# Patient Record
Sex: Male | Born: 1980
Health system: Southern US, Community
[De-identification: ages and names within clinical notes are randomized; demographics above are authoritative.]

---

## 2012-02-22 ENCOUNTER — Emergency Department: Payer: Self-pay | Admitting: Emergency Medicine

## 2013-10-14 ENCOUNTER — Other Ambulatory Visit: Payer: Self-pay | Admitting: Sports Medicine

## 2013-10-14 DIAGNOSIS — S92009A Unspecified fracture of unspecified calcaneus, initial encounter for closed fracture: Secondary | ICD-10-CM

## 2013-10-20 ENCOUNTER — Ambulatory Visit: Payer: Self-pay | Admitting: Sports Medicine

## 2013-11-14 ENCOUNTER — Emergency Department: Payer: Self-pay | Admitting: Emergency Medicine

## 2013-11-14 LAB — CBC
HCT: 45.5 % (ref 40.0–52.0)
HGB: 15.3 g/dL (ref 13.0–18.0)
MCH: 29.2 pg (ref 26.0–34.0)
MCHC: 33.6 g/dL (ref 32.0–36.0)
MCV: 87 fL (ref 80–100)
PLATELETS: 239 10*3/uL (ref 150–440)
RBC: 5.24 10*6/uL (ref 4.40–5.90)
RDW: 12.4 % (ref 11.5–14.5)
WBC: 8.3 10*3/uL (ref 3.8–10.6)

## 2013-11-15 LAB — BASIC METABOLIC PANEL
Anion Gap: 6 — ABNORMAL LOW (ref 7–16)
BUN: 19 mg/dL — ABNORMAL HIGH (ref 7–18)
Calcium, Total: 9.4 mg/dL (ref 8.5–10.1)
Chloride: 106 mmol/L (ref 98–107)
Co2: 26 mmol/L (ref 21–32)
Creatinine: 1.28 mg/dL (ref 0.60–1.30)
EGFR (African American): >60
Glucose: 101 mg/dL — ABNORMAL HIGH (ref 65–99)
Osmolality: 278 (ref 275–301)
POTASSIUM: 3.7 mmol/L (ref 3.5–5.1)
SODIUM: 138 mmol/L (ref 136–145)

## 2013-11-15 LAB — TROPONIN I: Troponin-I: <0.02 ng/mL

## 2013-11-17 DIAGNOSIS — I10 Essential (primary) hypertension: Secondary | ICD-10-CM | POA: Insufficient documentation

## 2013-11-17 DIAGNOSIS — R079 Chest pain, unspecified: Secondary | ICD-10-CM | POA: Insufficient documentation

## 2015-01-16 IMAGING — CT CT OF THE LEFT ANKLE WITHOUT CONTRAST
3 series · 11 of 33 positions shown, 13 images · non-contrast
Comparison: None.

CLINICAL DATA: Fracture of the heel.

EXAM:
CT OF THE LEFT ANKLE WITHOUT CONTRAST
TECHNIQUE: Multidetector CT imaging was performed according to the standard
protocol. Multiplanar CT image reconstructions were also generated.

[Series 6: ax ankle soft tissue 2.0 · axial · 0.20mm/px · z∈[+130,+232]mm · 3 of 83 slices shown, 4 images]
[im 19/83  soft-tissue]
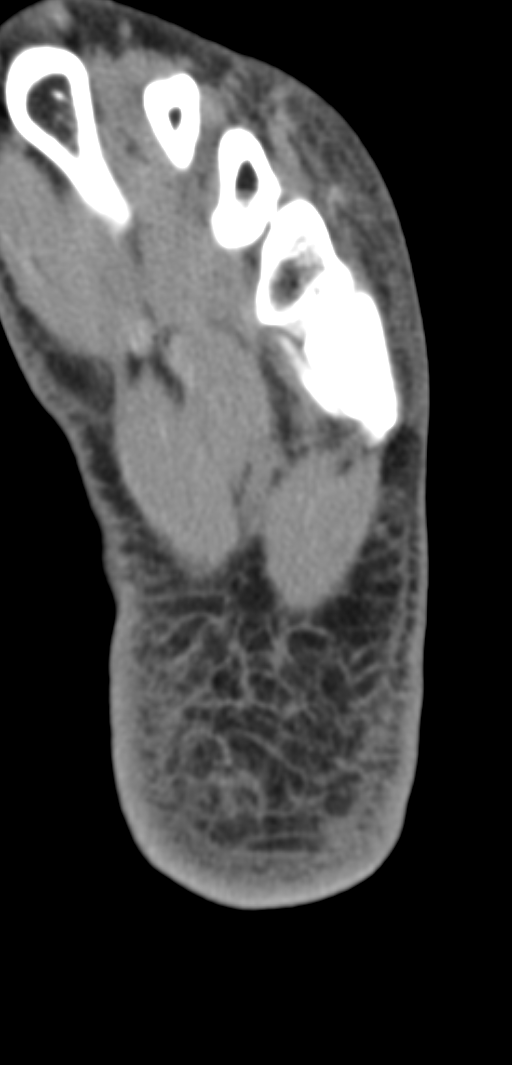
[im 19/83  bone]
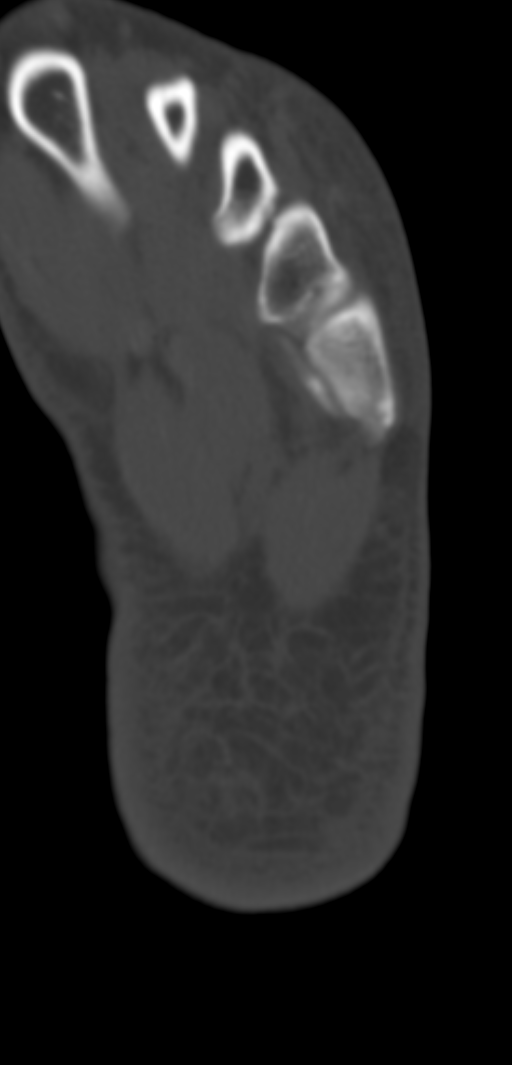
[im 45/83  bone]
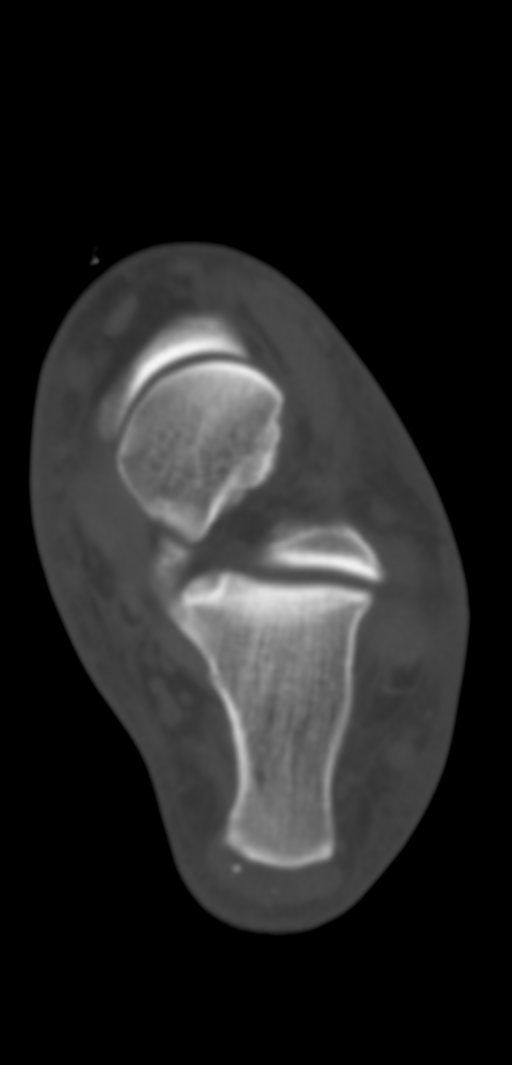
[im 70/83  bone]
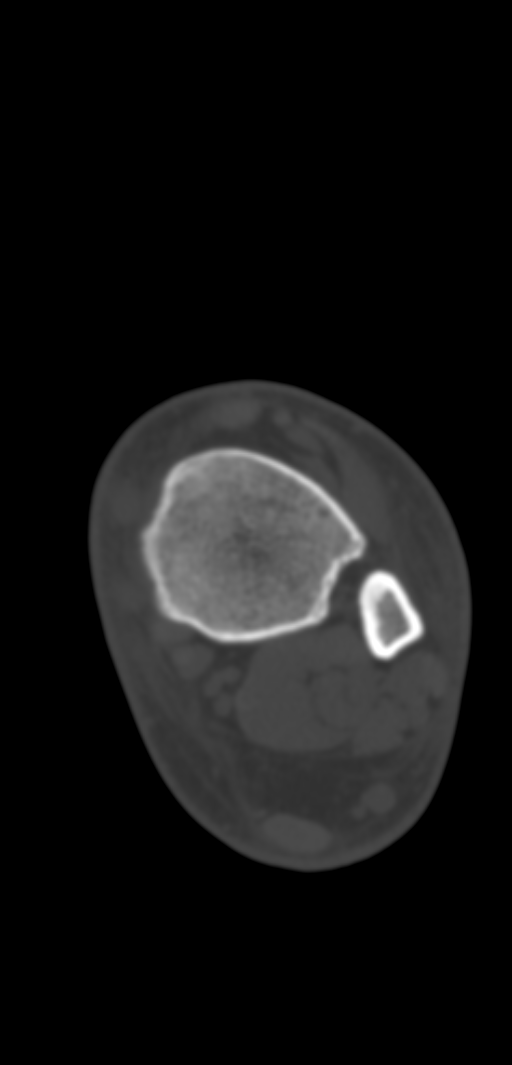

[Series 8: coronal ankle st 2.0 · coronal · 0.26mm/px · 3 of 85 slices shown]
[im 17/85  bone]
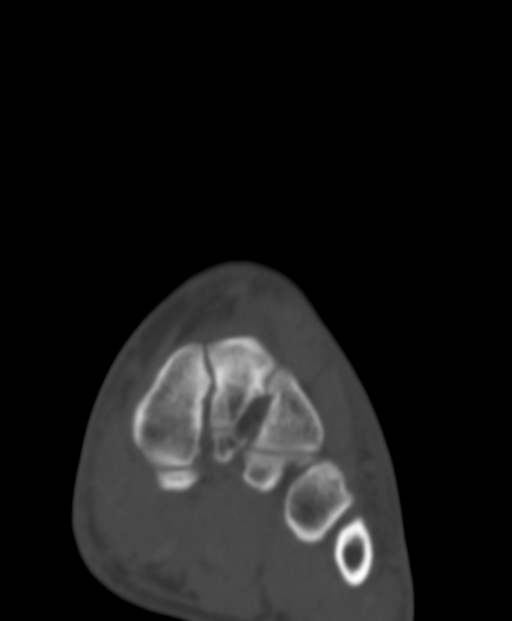
[im 34/85  bone]
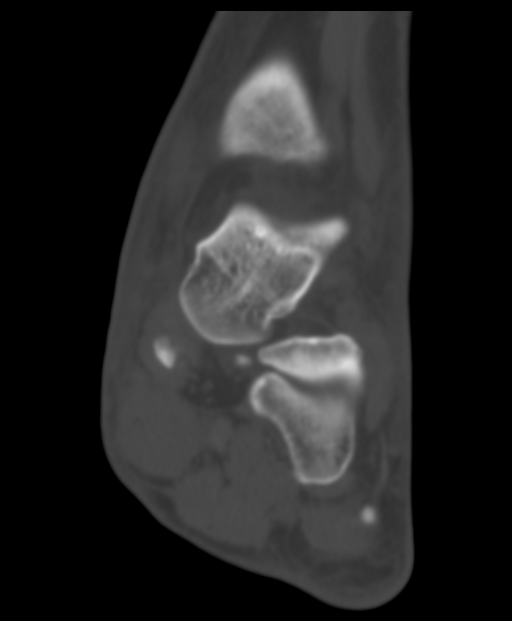
[im 51/85  bone]
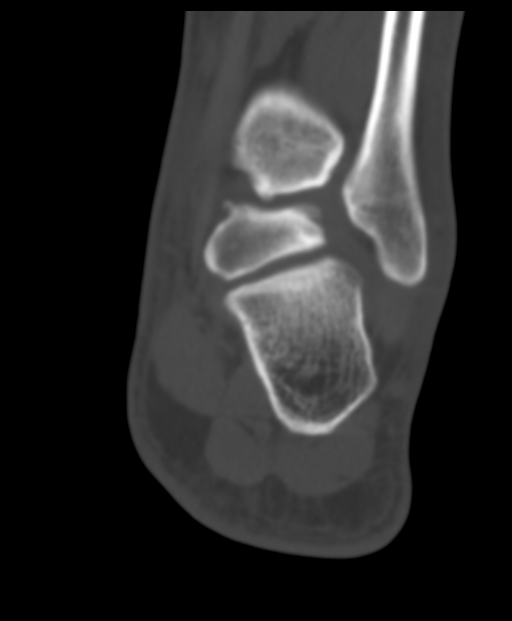

[Series 10: sagittal ankle st 2.0 · sagittal · 0.33mm/px · 5 of 51 slices shown, 6 images]
[im 17/51  bone]
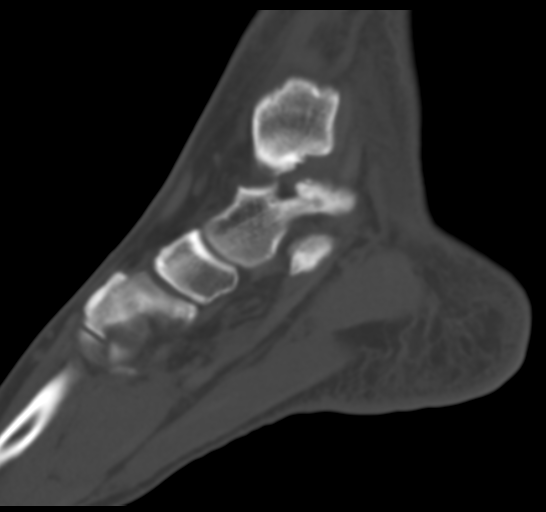
[im 21/51  bone]
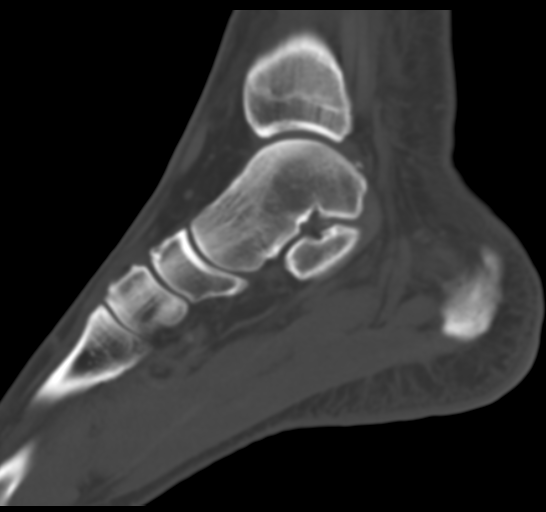
[im 26/51  soft-tissue]
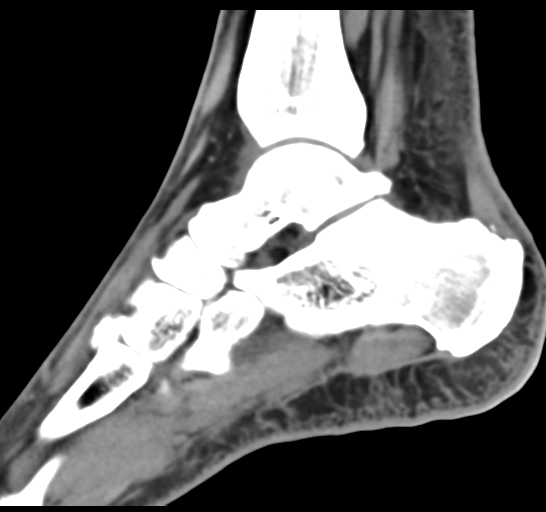
[im 26/51  bone]
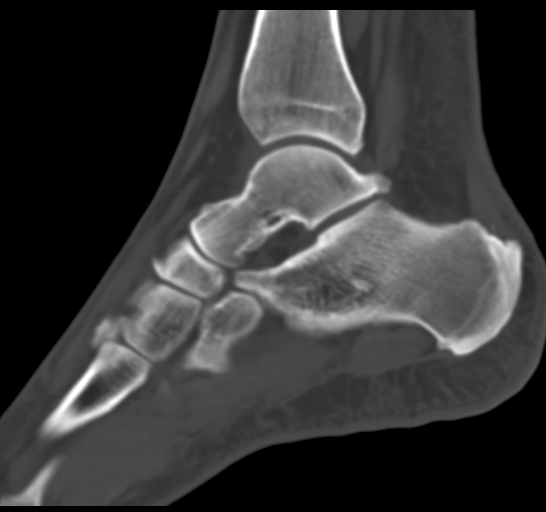
[im 30/51  bone]
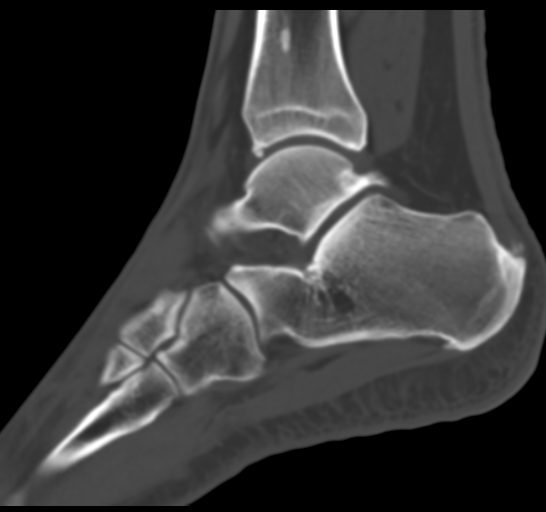
[im 34/51  bone]
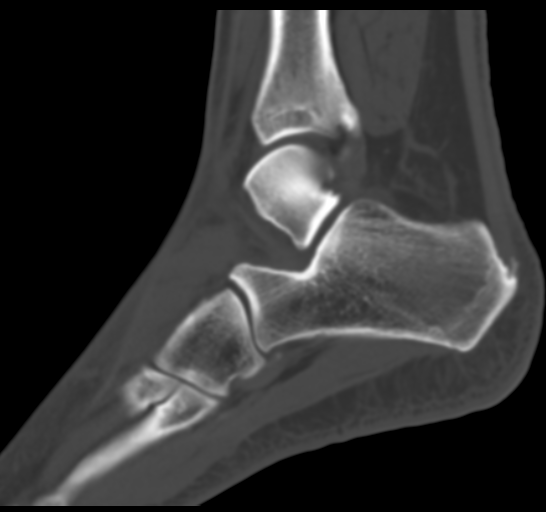

[11 of 33 positions shown; findings below may reference images not displayed]

FINDINGS: Tibiotalar joint spurring noted. An ossicle anteriorly along the
posterior subtalar joint on image 23 of series 9 seems well
corticated and probably represents a chronic fragmented osteophyte.
There are chronic fragmented osteophytes in the distal Achilles
tendon. Achilles and calcaneal spurring noted.

An os peroneus is present. Mild degenerative midfoot spurring. Type
2 accessory navicular. Minimal spur fragmentation along the anterior
tibial rim.

No specific tendon contour abnormality is observed. There is
subcutaneous edema medially and laterally in the ankle and tracking
along the lateral dorsum of the foot. There is edema along the
expected location of the anterior talofibular ligament.
IMPRESSION: 1. No well-defined fracture in the hindfoot is identified. If
clinically feasible, MRI can provide more sensitive assessment for
marrow edema and bone bruising, as well as soft tissue assessment.
2. Several fragmented osteophytes along the hindfoot have a chronic
appearance. Plantar and Achilles spurring is noted.
3. Subcutaneous edema medially and laterally in the ankle, and
tracking in the lateral dorsum of the foot.
4. There is some edema in the vicinity of the anterior talofibular
ligament, which could be sprained.

## 2019-04-01 ENCOUNTER — Emergency Department: Payer: Managed Care, Other (non HMO)

## 2019-04-01 ENCOUNTER — Other Ambulatory Visit: Payer: Self-pay

## 2019-04-01 ENCOUNTER — Emergency Department
Admission: EM | Admit: 2019-04-01 | Discharge: 2019-04-01 | Disposition: A | Discharge status: Home / Self Care | Payer: Managed Care, Other (non HMO) | Attending: Student in an Organized Health Care Education/Training Program | Admitting: Student in an Organized Health Care Education/Training Program

## 2019-04-01 DIAGNOSIS — Z20828 Contact with and (suspected) exposure to other viral communicable diseases: Secondary | ICD-10-CM | POA: Diagnosis not present

## 2019-04-01 DIAGNOSIS — J189 Pneumonia, unspecified organism: Secondary | ICD-10-CM | POA: Insufficient documentation

## 2019-04-01 DIAGNOSIS — R509 Fever, unspecified: Secondary | ICD-10-CM | POA: Diagnosis present

## 2019-04-01 LAB — CBC WITH DIFFERENTIAL/PLATELET
Abs Immature Granulocytes: 0.01 10*3/uL (ref 0.00–0.07)
Basophils Absolute: 0 10*3/uL (ref 0.0–0.1)
Basophils Relative: 0 %
Eosinophils Absolute: 0 10*3/uL (ref 0.0–0.5)
Eosinophils Relative: 0 %
HCT: 40.7 % (ref 39.0–52.0)
Hemoglobin: 13.8 g/dL (ref 13.0–17.0)
Immature Granulocytes: 0 %
Lymphocytes Relative: 24 %
Lymphs Abs: 0.7 10*3/uL (ref 0.7–4.0)
MCH: 29.1 pg (ref 26.0–34.0)
MCHC: 33.9 g/dL (ref 30.0–36.0)
MCV: 85.7 fL (ref 80.0–100.0)
Monocytes Absolute: 0.2 10*3/uL (ref 0.1–1.0)
Monocytes Relative: 6 %
Neutro Abs: 2.1 10*3/uL (ref 1.7–7.7)
Neutrophils Relative %: 70 %
Platelets: 155 10*3/uL (ref 150–400)
RBC: 4.75 MIL/uL (ref 4.22–5.81)
RDW: 11.5 % (ref 11.5–15.5)
WBC: 2.9 10*3/uL — ABNORMAL LOW (ref 4.0–10.5)
nRBC: 0 % (ref 0.0–0.2)

## 2019-04-01 LAB — URINALYSIS, COMPLETE (UACMP) WITH MICROSCOPIC
Bacteria, UA: NONE SEEN
Bilirubin Urine: NEGATIVE
Glucose, UA: NEGATIVE mg/dL
Hgb urine dipstick: NEGATIVE
Ketones, ur: NEGATIVE mg/dL
Leukocytes,Ua: NEGATIVE
Nitrite: NEGATIVE
Protein, ur: 100 mg/dL — AB
Specific Gravity, Urine: 1.029 (ref 1.005–1.030)
pH: 5 (ref 5.0–8.0)

## 2019-04-01 LAB — COMPREHENSIVE METABOLIC PANEL
ALT: 97 U/L — ABNORMAL HIGH (ref 0–44)
AST: 77 U/L — ABNORMAL HIGH (ref 15–41)
Albumin: 3.7 g/dL (ref 3.5–5.0)
Alkaline Phosphatase: 71 U/L (ref 38–126)
Anion gap: 10 (ref 5–15)
BUN: 12 mg/dL (ref 6–20)
CO2: 24 mmol/L (ref 22–32)
Calcium: 8.5 mg/dL — ABNORMAL LOW (ref 8.9–10.3)
Chloride: 102 mmol/L (ref 98–111)
Creatinine, Ser: 0.96 mg/dL (ref 0.61–1.24)
GFR calc Af Amer: >60 mL/min (ref >60)
GFR calc non Af Amer: >60 mL/min (ref >60)
Glucose, Bld: 111 mg/dL — ABNORMAL HIGH (ref 70–99)
Potassium: 3.8 mmol/L (ref 3.5–5.1)
Sodium: 136 mmol/L (ref 135–145)
Total Bilirubin: 0.8 mg/dL (ref 0.3–1.2)
Total Protein: 7.1 g/dL (ref 6.5–8.1)

## 2019-04-01 LAB — INFLUENZA PANEL BY PCR (TYPE A & B)
Influenza A By PCR: NEGATIVE
Influenza B By PCR: NEGATIVE

## 2019-04-01 LAB — LACTIC ACID, PLASMA: Lactic Acid, Venous: 1 mmol/L (ref 0.5–1.9)

## 2019-04-01 LAB — SARS CORONAVIRUS 2 BY RT PCR (HOSPITAL ORDER, PERFORMED IN ~~LOC~~ HOSPITAL LAB): SARS Coronavirus 2: NEGATIVE

## 2019-04-01 MED ORDER — IBUPROFEN 600 MG PO TABS
600.0000 mg | ORAL_TABLET | Freq: Once | ORAL | Status: AC
  Administered 2019-04-01: 17:00:00 600 mg via ORAL
  Filled 2019-04-01: qty 1

## 2019-04-01 MED ORDER — AZITHROMYCIN 250 MG PO TABS: ORAL_TABLET | ORAL | 0 refills | Status: AC

## 2019-04-01 MED ORDER — SODIUM CHLORIDE 0.9 % IV BOLUS
1000.0000 mL | Freq: Once | INTRAVENOUS | Status: AC
  Administered 2019-04-01: 18:00:00 1000 mL via INTRAVENOUS

## 2019-04-01 MED ORDER — SODIUM CHLORIDE 0.9 % IV SOLN
1.0000 g | Freq: Once | INTRAVENOUS | Status: AC
  Administered 2019-04-01: 19:00:00 1 g via INTRAVENOUS
  Filled 2019-04-01: qty 10

## 2019-04-01 NOTE — ED Triage Notes (Addendum)
Reports sinus congestion X approx 1 week-states that he was seen at [facility] and dx with URI. Pt has been taking his antibiotics X 3 days. Pt reports cough, congestion. Denies sore throat.   Pt has not been tested for coronavirus. Pt alert and oriented X4, cooperative, RR even and unlabored, color WNL. Pt in NAD. Pt took his OTC cough and decongestant medications that included tylenol PTA. Fever started this afternoon.

## 2019-04-01 NOTE — ED Provider Notes (Signed)
[facility] Emergency Department Provider Note  ____________________________________________  Time seen: Approximately 5:24 PM  I have reviewed the triage vital signs and the nursing notes.   HISTORY  Chief Complaint Facial Pain and Fever    HPI Duane Shaw is a 38 y.o. male presents to the emergency department with fever that started today.  Patient reports that his fever has been as high as [address] F assessed orally at home and has not improved after taking 1500 mg of Tylenol.  Patient states that 3 days ago, he sought care with a local urgent care due to headache and nasal congestion.  Patient states that he has since developed cough and body aches today and has had mild diarrhea.  Patient states that he associated diarrhea with amoxicillin use as he was diagnosed with sinusitis by urgent care.  He denies chest pain, chest tightness or abdominal pain.  He denies neck pain or recent tick bites.  No new rash.  Patient states that he has had chills at home.  No other alleviating measures have been attempted.        History reviewed. No pertinent past medical history.  There are no active problems to display for this patient.   History reviewed. No pertinent surgical history.  Prior to Admission medications   Medication Sig Start Date End Date Taking? Authorizing Provider  amoxicillin-clavulanate (AUGMENTIN) 875-125 MG tablet Take 1 tablet by mouth 2 (two) times daily. 03/29/19  Yes [provider]  azithromycin (ZITHROMAX Z-PAK) 250 MG tablet Take 2 tablets (500 mg) on  Day 1,  followed by 1 tablet (250 mg) once daily on Days 2 through 5. 04/01/19 04/06/19  Orvil FeilWoods, Lanell Dubie M, PA-C    Allergies Patient has no known allergies.  No family history on file.  Social History Social History   Tobacco Use  . Smoking status: Never Smoker  Substance Use Topics  . Alcohol use: Yes  . Drug use: Not on file      Review of Systems  Constitutional:  Patient has fever.  Eyes: No visual changes. No discharge ENT: Patient has congestion.  Cardiovascular: no chest pain. Respiratory: Patient has cough.  Gastrointestinal: No abdominal pain.  No nausea, no vomiting. Patient had diarrhea.  Genitourinary: Negative for dysuria. No hematuria Musculoskeletal: Patient has myalgias.  Skin: Negative for rash, abrasions, lacerations, ecchymosis. Neurological: Patient has headache, no focal weakness or numbness.     ____________________________________________   PHYSICAL EXAM:  VITAL SIGNS: ED Triage Vitals [04/01/19 1631]  Enc Vitals Group     BP 136/70     Pulse Rate (!) 107     Resp 18     Temp (!) 102.6 F (39.2 C)     Temp Source Oral     SpO2 100 %     Weight 280 lb (127 kg)     Height 6\' 2"  (1.88 m)     Head Circumference      Peak Flow      Pain Score 5     Pain Loc      Pain Edu?      Excl. in GC?      Constitutional: Alert and oriented. Patient is lying supine. Eyes: Conjunctivae are normal. PERRL. EOMI. Head: Atraumatic. ENT:      Ears: Tympanic membranes are mildly injected with mild effusion bilaterally.       Nose: No congestion/rhinnorhea.      Mouth/Throat: Mucous membranes are moist. Posterior pharynx is mildly erythematous.  Hematological/Lymphatic/Immunilogical: No cervical lymphadenopathy.  Cardiovascular: Normal rate, regular rhythm. Normal S1 and S2.  Good peripheral circulation. Respiratory: Normal respiratory effort without tachypnea or retractions. Lungs CTAB. Good air entry to the bases with no decreased or absent breath sounds. Gastrointestinal: Bowel sounds 4 quadrants. Soft and nontender to palpation. No guarding or rigidity. No palpable masses. No distention. No CVA tenderness. Musculoskeletal: Full range of motion to all extremities. No gross deformities appreciated. Neurologic:  Normal speech and language. No gross focal neurologic deficits are appreciated.  Skin:  Skin is warm, dry and  intact. No rash noted. Psychiatric: Mood and affect are normal. Speech and behavior are normal. Patient exhibits appropriate insight and judgement.    ____________________________________________   LABS (all labs ordered are listed, but only abnormal results are displayed)  Labs Reviewed  CBC WITH DIFFERENTIAL/PLATELET - Abnormal; Notable for the following components:      Result Value   WBC 2.9 (*)    All other components within normal limits  COMPREHENSIVE METABOLIC PANEL - Abnormal; Notable for the following components:   Glucose, Bld 111 (*)    Calcium 8.5 (*)    AST 77 (*)    ALT 97 (*)    All other components within normal limits  URINALYSIS, COMPLETE (UACMP) WITH MICROSCOPIC - Abnormal; Notable for the following components:   Color, Urine YELLOW (*)    APPearance HAZY (*)    Protein, ur 100 (*)    All other components within normal limits  SARS CORONAVIRUS 2 BY RT PCR (HOSPITAL ORDER, PERFORMED IN [facility])  INFLUENZA PANEL BY PCR (TYPE A & B)  LACTIC ACID, PLASMA   ____________________________________________  EKG   ____________________________________________  RADIOLOGY I personally viewed and evaluated these images as part of my medical decision making, as well as reviewing the written report by the radiologist.    Dg Chest 1 View  Result Date: 04/01/2019 CLINICAL DATA:  38 year old male with concern for pneumonia. EXAM: CHEST  1 VIEW COMPARISON:  Chest radiograph dated 11/14/2013 FINDINGS: Mild diffuse interstitial prominence and streaky densities which may represent atelectasis or developing infiltrate. Clinical correlation is recommended. No focal consolidation, pleural effusion, or pneumothorax. The cardiac silhouette is within normal limits. No acute osseous pathology. IMPRESSION: 1. No focal consolidation. 2. Mild diffuse interstitial prominence and streaky densities may represent atelectasis or developing infiltrate. Electronically Signed    By: Elgie Collard M.D.   On: 04/01/2019 17:40    ____________________________________________    PROCEDURES  Procedure(s) performed:    Procedures    Medications  sodium chloride 0.9 % bolus 1,000 mL (0 mLs Intravenous Stopped 04/01/19 1930)  ibuprofen (ADVIL) tablet 600 mg (600 mg Oral Given 04/01/19 1707)  cefTRIAXone (ROCEPHIN) 1 g in sodium chloride 0.9 % 100 mL IVPB (0 g Intravenous Stopped 04/01/19 1933)     ____________________________________________   INITIAL IMPRESSION / ASSESSMENT AND PLAN / ED COURSE  Pertinent labs & imaging results that were available during my care of the patient were reviewed by me and considered in my medical decision making (see chart for details).  Review of the [place] CSRS was performed in accordance of the [facility] prior to dispensing any controlled drugs.          Assessment and Plan:  Pneumonia 38 year old male presents to the emergency department with fever and mild tachycardia.  Patient states for the past 3 to 4 days, he has had headache, body aches, nasal congestion and mild diarrhea.  He has been  taking amoxicillin for prior diagnosis of sinusitis by local urgent care.  On physical exam, patient is active and talkative.  His mucous membranes are moist.  No adventitious lung sounds were auscultated on exam patient had no increased work of breathing.   Differential diagnosis included community-acquired pneumonia, COVID-19, unspecified viral URI, influenza..  Chest x-ray revealed streaky densities suggesting early infiltrate.  Basic labs were obtained and LFTs were mildly elevated.  Leukopenia was identified on CBC, 2.9.  COVID-19 testing was negative.  Influenza a and B testing was negative.  Lactic was within reference range.  Patient received supplemental fluids in the emergency department as well as IV Rocephin.  I suspect a viral source for source for early pneumonia on chest x-ray given leukopenia on CBC.  Patient was  covered with azithromycin at discharge.  He was advised to discontinue amoxicillin use.  Return precautions were given to return to the emergency department with new or worsening symptoms.  He voiced understanding.  Fever trended down with ibuprofen given in ED.   Duane Shaw was evaluated in Emergency Department on 04/01/2019 for the symptoms described in the history of present illness. He was evaluated in the context of the global COVID-19 pandemic, which necessitated consideration that the patient might be at risk for infection with the SARS-CoV-2 virus that causes COVID-19. Institutional protocols and algorithms that pertain to the evaluation of patients at risk for COVID-19 are in a state of rapid change based on information released by regulatory bodies including the CDC and federal and state organizations. These policies and algorithms were followed during the patient's care in the ED.   ____________________________________________  FINAL CLINICAL IMPRESSION(S) / ED DIAGNOSES  Final diagnoses:  Community acquired pneumonia, unspecified laterality      NEW MEDICATIONS STARTED DURING THIS VISIT:  ED Discharge Orders         Ordered    azithromycin (ZITHROMAX Z-PAK) 250 MG tablet     04/01/19 1905              This chart was dictated using voice recognition software/Dragon. Despite best efforts to proofread, errors can occur which can change the meaning. Any change was purely unintentional.    Lannie Fields, PA-C 04/01/19 2025    Merlyn Lot, MD 04/01/19 2208

## 2019-05-04 DIAGNOSIS — R7303 Prediabetes: Secondary | ICD-10-CM | POA: Insufficient documentation

## 2020-06-27 IMAGING — DX DG CHEST 1V
1 series · 1 of 1 positions shown · non-contrast
Comparison: Chest radiograph dated 11/14/2013

CLINICAL DATA: 38-year-old male with concern for pneumonia.

EXAM:
CHEST  1 VIEW

[chest ap]
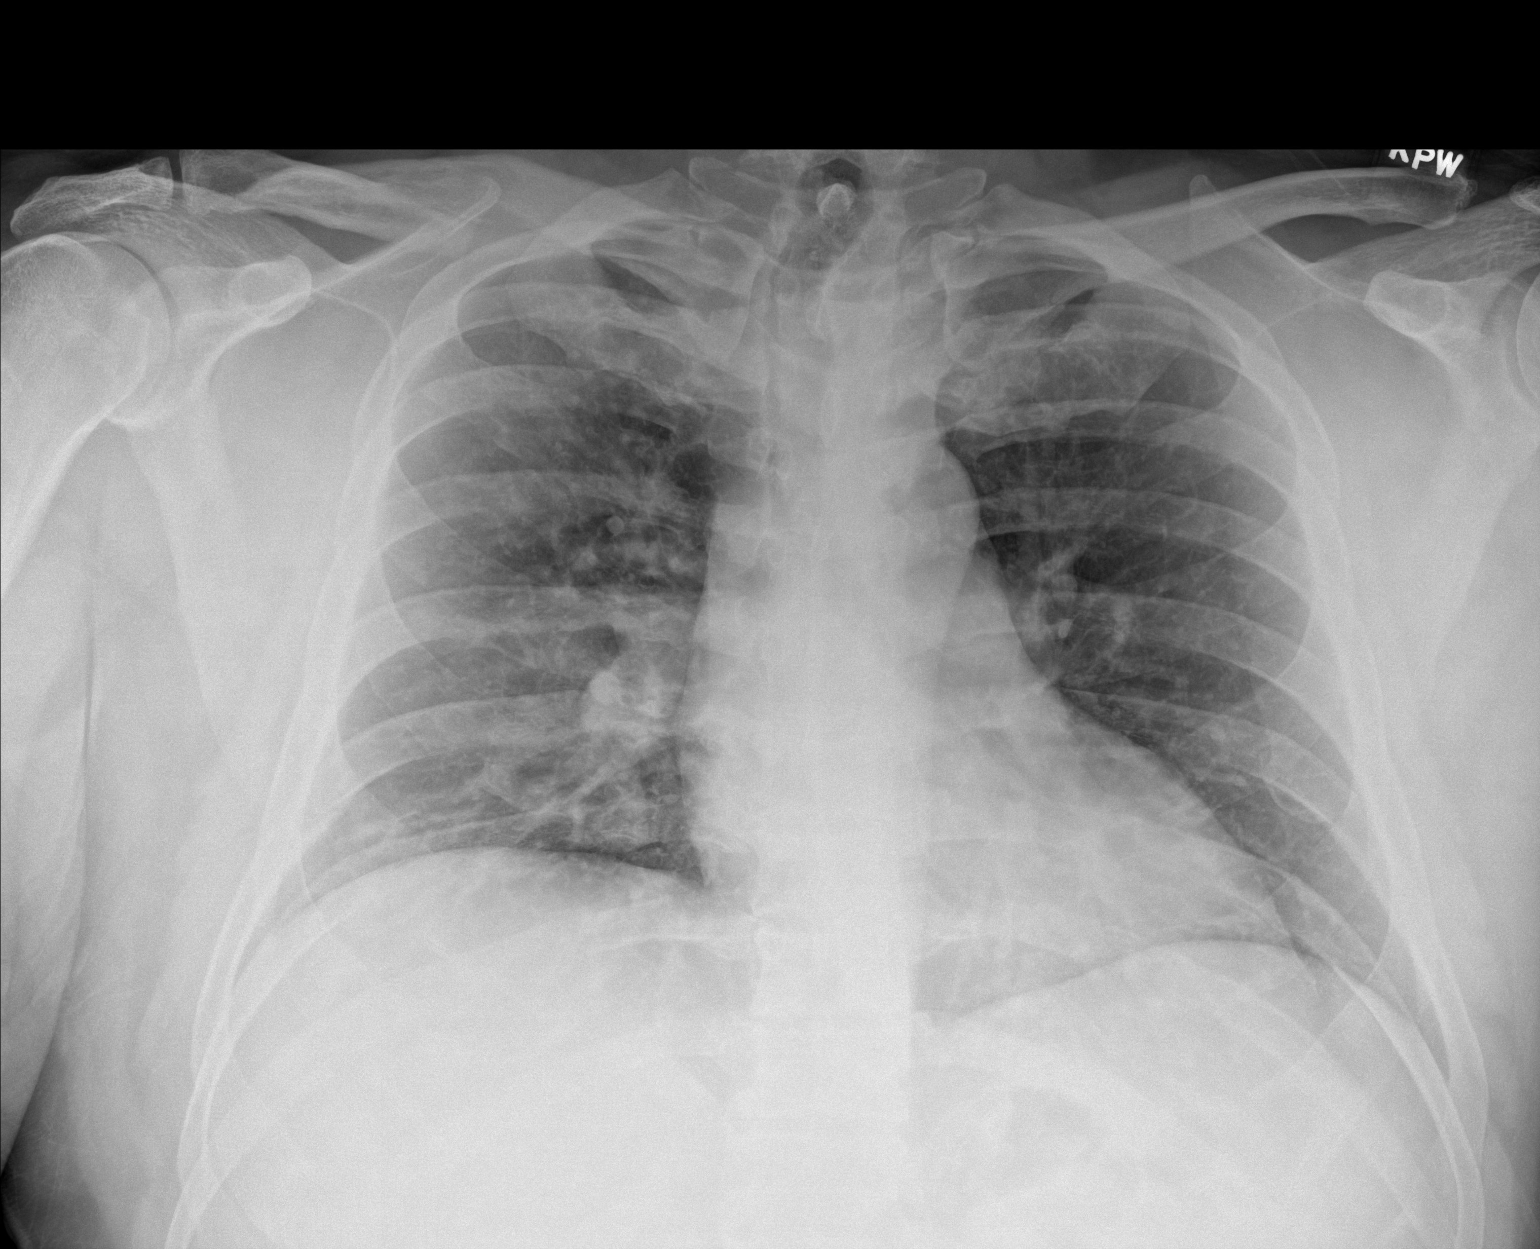

[1 of 1 positions shown; findings below may reference images not displayed]

FINDINGS: Mild diffuse interstitial prominence and streaky densities which may
represent atelectasis or developing infiltrate. Clinical correlation
is recommended. No focal consolidation, pleural effusion, or
pneumothorax. The cardiac silhouette is within normal limits. No
acute osseous pathology.
IMPRESSION: 1. No focal consolidation.
2. Mild diffuse interstitial prominence and streaky densities may
represent atelectasis or developing infiltrate.

## 2021-08-23 NOTE — Progress Notes (Signed)
? ? ?  Subjective:   ? ?CC: L foot pain ? ?I, Wendy Poet, LAT, ATC, am serving as scribe for Dr. [name]. ? ?HPI: Pt is a 41 y/o male presenting w/ c/o L foot pain x 5 months.  He locates his pain to his L medial calcaneus. ?He has tried multiple treatment options including band based exercises night splints icing and orthotics as below.  He is about to get custom orthotics ordered by [employer]. ? 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  He needs to wear protective shoes for this. ? ?L foot swelling: yes ?Aggravating factors: walking/weight-bearing activity;  ?Treatments tried: [employer] inserts ordered but hasn't received; Dr. Felicie Morn PF inserts; ice; night splint; massage gun to his calf; calf stretching ? ?Pertinent review of Systems: No fevers or chills ? ?Relevant historical information: History of shoulder pain ultimately requiring shoulder surgery. ? ? ?Objective:   ? ?Vitals:  ? 08/24/21 0827  ?BP: 130/88  ?Pulse: 75  ?SpO2: 95%  ? ?General: Well Developed, well nourished, and in no acute distress.  ? ?MSK: Left foot mild pes planus.  Tender palpation along plantar medial calcaneus. ?Also tender palpation along the medial ankle along the course of the posterior tibialis tendon. ?Normal foot and ankle motion. ?Intact strength. ?Antalgic gait. ? ?Lab and Radiology Results ? ?X-ray images left calcaneus obtained today personally and independently interpreted ?Calcaneal osteophyte at the plantar and posterior aspect of the calcaneus. ?No acute fractures are visible. ?Await formal radiology review  ? ? ? ?Impression and Recommendations:   ? ?Assessment and Plan: ?41 y.o. male with left plantar calcaneal pain.  This is a chronic issue now ongoing for 5 months despite good trial of conservative management including home exercise program, arch support, heel cushioning, icing, and arch straps.  Recommend maximizing  conservative management to include weight based eccentric Alfredson's type exercises.  And will start nitroglycerin patch protocol.  Recheck in 1 month.  If not improved consider steroid injection or switch to extracorporeal shockwave therapy.  May be moving shortly to MRI in the near future.. ? ?PDMP not reviewed this encounter. ?Orders Placed This Encounter  ?Procedures  ? [place] LIMITED JOINT SPACE STRUCTURES LOW LEFT(NO LINKED CHARGES)  ?  Order Specific Question:   Reason for Exam (SYMPTOM  OR DIAGNOSIS REQUIRED)  ?  Answer:   L foot pain  ?  Order Specific Question:   Preferred imaging location?  ?  Answer:   [place]  ? DG Os Calcis Left  ?  Standing Status:   Future  ?  Number of Occurrences:   1  ?  Standing Expiration Date:   08/25/2022  ?  Order Specific Question:   Reason for Exam (SYMPTOM  OR DIAGNOSIS REQUIRED)  ?  Answer:   heel pain  ?  Order Specific Question:   Preferred imaging location?  ?  Answer:   [facility]  ? ?Meds ordered this encounter  ?Medications  ? nitroGLYCERIN (NITRODUR - DOSED IN MG/24 HR) 0.2 mg/hr patch  ?  Sig: Apply 1/4 patch daily to tendon for tendonitis.  ?  Dispense:  30 patch  ?  Refill:  1  ? ? ?Discussed warning signs or symptoms. Please see discharge instructions. Patient expresses understanding. ? ? ?The above documentation has been reviewed and is accurate and complete [name], M.D. ? ?

## 2021-08-24 ENCOUNTER — Ambulatory Visit (INDEPENDENT_AMBULATORY_CARE_PROVIDER_SITE_OTHER): Payer: 59

## 2021-08-24 ENCOUNTER — Ambulatory Visit (INDEPENDENT_AMBULATORY_CARE_PROVIDER_SITE_OTHER): Payer: 59 | Admitting: Family Medicine

## 2021-08-24 ENCOUNTER — Ambulatory Visit: Payer: Self-pay

## 2021-08-24 ENCOUNTER — Other Ambulatory Visit: Payer: Self-pay

## 2021-08-24 VITALS — BP 130/88 | HR 75 | Ht 74.0 in | Wt 293.2 lb

## 2021-08-24 DIAGNOSIS — M79672 Pain in left foot: Secondary | ICD-10-CM

## 2021-08-24 DIAGNOSIS — M722 Plantar fascial fibromatosis: Secondary | ICD-10-CM

## 2021-08-24 MED ORDER — NITROGLYCERIN 0.2 MG/HR TD PT24: MEDICATED_PATCH | TRANSDERMAL | 1 refills | Status: AC

## 2021-08-24 NOTE — Patient Instructions (Addendum)
Nice to meet you today. ? ?Do the slow heel lowering exercises as discussed during your visit today, taking 5-7 sec to lower down from a raised heel position.  Do 30-45 reps daily. ? ?Nitroglycerin Protocol ? ?Apply 1/4 nitroglycerin patch to affected area daily. ?Change position of patch within the affected area every 24 hours. ?You may experience a headache during the first 1-2 weeks of using the patch, these should subside. ?If you experience headaches after beginning nitroglycerin patch treatment, you may take your preferred over the counter pain reliever. ?Another side effect of the nitroglycerin patch is skin irritation or rash related to patch adhesive. ?Please notify our office if you develop more severe headaches or rash, and stop the patch. ?Tendon healing with nitroglycerin patch may require 12 to 24 weeks depending on the extent of injury. ?Men should not use if taking Viagra, Cialis, or Levitra.  ?Do not use if you have migraines or rosacea.   ? ?Follow-up: 1 month.  ? ?Xray today.  ? ? ?[employer].  ? ? ?

## 2021-08-24 NOTE — Progress Notes (Signed)
Left heel x-ray does show a heel spur at the plantar fascia insertion site and at the Achilles tendon insertion site at the heel.  Otherwise the heel bone looks okay on x-ray.

## 2021-08-25 ENCOUNTER — Telehealth: Payer: Self-pay | Admitting: Family Medicine

## 2021-08-25 NOTE — Telephone Encounter (Signed)
Called pt and advised that he is to con't w/ his HEP as prescribed at his visit earlier this week.  Regarding nitro patches, advised pt that Dr. Denyse Amass called pt's pharmacy this morning and left a message on their VM advising them to prescribe patches like he instructed.  We are using the nitro patches for an orthopedic reason, and therefore, it's fine to cut the patches into quarters as prescribed.  Advise pt to contact us back if he con't to have an issue w/ his pharmacy and the nitro patches.  Pt verbalizes understanding. ?

## 2021-08-25 NOTE — Telephone Encounter (Signed)
Pt called, viewed xray results and wonders if their should be any change in the prescribed exercises. ? ?Also, unable to p/u Nitro as pharmacy states they reached out to [place] for [employer]. ?

## 2021-08-25 NOTE — Telephone Encounter (Signed)
I received a fax from [facility] pharmacy in [place] informing me that nitroglycerin patches should not be cut.  I called the pharmacy and left a message with the pharmacist saying that I am aware of this but asked them to dispense them anyway.  I asked the pharmacist to call me back in my office number if there are any problems. ?

## 2021-09-20 NOTE — Progress Notes (Signed)
? ?  I, Christoper Fabian, LAT, ATC, am serving as scribe for Dr. Clementeen Graham. ? ?Duane Shaw is a 41 y.o. male who presents to [facility] today for f/u of L medial heel pain/plantar fasciitis.  He was last seen by Dr. Denyse Amass on 08/24/21 and was shown Duane Shaw exercises and started on nitroglycerin patches.  He was also advised to use a CAM walker boot.  Today, pt reports that he notices little change in his symptoms.  He is using the nitro patches and has been doing his Duane Shaw exercises.  He states that he is not able to do cardio at the gym due to the pain.  He locates his pain to the plantar border of his calcaneus w/ radiating pain into his L medial arch. ? ?Diagnostic testing: L heel XR- 08/24/21 ? ?Pertinent review of systems: no fever or chills ? ?Relevant historical information: HTN ? ? ?Exam:  ?BP 104/80 (BP Location: Right Arm, Patient Position: Sitting, Cuff Size: Large)   Pulse 78   Ht 6\' 2"  (1.88 m)   Wt 290 lb (131.5 kg)   SpO2 96%   BMI 37.23 kg/m?  ?General: Well Developed, well nourished, and in no acute distress.  ? ?MSK: Left Heel: ?Normal appearing ?Mildly tender palpation plantar heel. ?Normal foot and ankle motion. ?Mild antalgic gait. ? ? ? ?Lab and Radiology Results ?EXAM: ?LEFT OS CALCIS - 2+ VIEW ?  ?COMPARISON:  CT left ankle 10/20/2013 ?  ?FINDINGS: ?Moderate chronic spurring at the Achilles insertion on the ?calcaneus. Moderate plantar calcaneal heel spur appears mildly ?increased from 10/20/2013 CT. Mild dorsal navicular-cuneiform ?degenerative osteophytosis. Mild distal anterior tibial plafond ?degenerative osteophytosis. ?  ?IMPRESSION: ?Moderate chronic spurring at the Achilles and plantar fascia ?insertions on the calcaneus. ?  ?  ?Electronically Signed ?  By: 12/20/2013 M.D. ?  On: 08/24/2021 12:01 ?I, 10/24/2021, personally (independently) visualized and performed the interpretation of the images attached in this note. ? ? ? ? ?Assessment  and Plan: ?41 y.o. male with left heel pain.  Pain is thought to be Planter fasciitis but could be something more concerning such as calcaneus stress fracture.  Although Planter fasciitis is most likely.  He also has some medial ankle pain that I think is likely to be posterior tibialis tendinitis from limping.  He has tried some conservative management over the last month in addition to his prior attempts of conservative management.  He has tried Duane Shaw eccentric exercises and nitroglycerin patch protocol which did not help very much.  We will try shockwave therapy.  If not improved consider MRI and injection. ? ? ?PDMP not reviewed this encounter. ?Orders Placed This Encounter  ?Procedures  ? AMB referral to sports medicine  ?  Referral Priority:   Routine  ?  Referral Type:   Consultation  ?  Number of Visits Requested:   1  ? ?No orders of the defined types were placed in this encounter. ? ? ? ?Discussed warning signs or symptoms. Please see discharge instructions. Patient expresses understanding. ? ? ?The above documentation has been reviewed and is accurate and complete 55, M.D. ? ? ?

## 2021-09-25 ENCOUNTER — Ambulatory Visit: Payer: 59 | Admitting: Family Medicine

## 2021-09-26 ENCOUNTER — Ambulatory Visit (INDEPENDENT_AMBULATORY_CARE_PROVIDER_SITE_OTHER): Payer: 59 | Admitting: Family Medicine

## 2021-09-26 ENCOUNTER — Encounter: Payer: Self-pay | Admitting: Family Medicine

## 2021-09-26 VITALS — BP 104/80 | HR 78 | Ht 74.0 in | Wt 290.0 lb

## 2021-09-26 DIAGNOSIS — M722 Plantar fascial fibromatosis: Secondary | ICD-10-CM | POA: Diagnosis not present

## 2021-09-26 DIAGNOSIS — M79672 Pain in left foot: Secondary | ICD-10-CM

## 2021-09-26 NOTE — Patient Instructions (Addendum)
Good to see you today. ? ?Con't the nitro patches and Alfredson's exercises. ? ?I've referred you for shock wave therapy. ? ?Follow-up: 6 weeks ?

## 2021-09-28 ENCOUNTER — Ambulatory Visit (INDEPENDENT_AMBULATORY_CARE_PROVIDER_SITE_OTHER): Payer: Self-pay | Admitting: Sports Medicine

## 2021-09-28 DIAGNOSIS — M722 Plantar fascial fibromatosis: Secondary | ICD-10-CM

## 2021-09-29 NOTE — Progress Notes (Signed)
Patient ID: GYASI HAZZARD, male   DOB: [birth year], 41 y.o.   MRN: [id number] ? ?Chief complaint: Left heel pain ? ?Demitri is a very pleasant 41 year old male sent over at the request of Dr. Denyse Amass for shockwave treatment of his left foot Planter fasciitis.  He remains under the care of Dr. Denyse Amass.  He is currently being treated with home exercises, custom orthotics, night splinting, and topical nitroglycerin.  He is interested in trying the shockwave treatment.  Treatment was performed as below.  He tolerated this without difficulty.  I recommended that we initially try up to 4 weekly treatments.  He is already scheduled for his second treatment next week.  Of note, he tolerated today's treatment quite well so next week I would anticipate increasing both the power and the frequency. ? ?Procedure: ECSWT ?Indications: Left Planter fasciitis ?  ?Procedure Details ?Consent: Risks of procedure as well as the alternatives and risks of each were explained to the patient.  Written consent for procedure obtained. ?Time Out: Verified patient identification, verified procedure, site was marked, verified correct patient position, medications/allergies/relevent history reviewed.  The area was cleaned with alcohol swab.   ?  ?The left plantar fascia was targeted for Extracorporeal shockwave therapy.  ?  ?Preset: Planter fasciitis ?Power Level: 90 ?Frequency: 10 ?Impulse/cycles: 2500 ?Head size: Large ?  ?Patient tolerated procedure well without immediate complications ?   ? ?

## 2021-10-04 ENCOUNTER — Ambulatory Visit (INDEPENDENT_AMBULATORY_CARE_PROVIDER_SITE_OTHER): Payer: Self-pay | Admitting: Family Medicine

## 2021-10-04 DIAGNOSIS — M722 Plantar fascial fibromatosis: Secondary | ICD-10-CM

## 2021-10-04 NOTE — Progress Notes (Signed)
? ?  Duane Shaw is a 41 y.o. male who presents to [facility] today for the following: ? ?Left foot Plantar fasciitis ?Patient presents today for his second shockwave therapy ?Did well, but was on vacation, so not sure how much it helped ? ? ?Procedure: ECSWT ?Indications: Left foot Plantar fasciitis ?  ?Procedure Details ?Consent: Risks of procedure as well as the alternatives and risks of each were explained to the patient.  Written consent for procedure obtained. ?Time Out: Verified patient identification, verified procedure, site was marked, verified correct patient position, medications/allergies/relevent history reviewed.  The area was cleaned with alcohol swab.   ?  ?The left plantar fascia was targeted for Extracorporeal shockwave therapy.  ?  ?Preset: Plantar fasciitis ?Power Level: 110 ?Frequency: 12 ?Impulse/cycles: 2500 ?Head size: large ?  ?Patient tolerated procedure well without immediate complications ? ? ? ?Luis Abed, D.O.  ?PGY-4 [facility]  ?10/04/2021 4:14 PM ? ?Addendum:  I was the preceptor for this visit and available for immediate consultation.  Norton Blizzard MD CAQSM ? ?

## 2021-10-11 ENCOUNTER — Ambulatory Visit (INDEPENDENT_AMBULATORY_CARE_PROVIDER_SITE_OTHER): Payer: Self-pay | Admitting: Family Medicine

## 2021-10-11 VITALS — Ht 74.0 in

## 2021-10-11 DIAGNOSIS — M722 Plantar fascial fibromatosis: Secondary | ICD-10-CM

## 2021-10-11 NOTE — Progress Notes (Signed)
? ?  Duane Shaw is a 41 y.o. male who presents to [facility] today for the following: ? ?Left Planter fasciitis ?Patient presents today for his third shockwave therapy ?Has noticed some mild improvement ? ?Procedure: ECSWT ?Indications:  Left plantar fasciitis ?  ?Procedure Details ?Consent: Risks of procedure as well as the alternatives and risks of each were explained to the patient.  Written consent for procedure obtained. ?Time Out: Verified patient identification, verified procedure, site was marked, verified correct patient position, medications/allergies/relevent history reviewed.  The area was cleaned with alcohol swab.   ?  ?The left plantar fascia was targeted for Extracorporeal shockwave therapy.  ?  ?Preset: chronic plantar fasciitis ?Power Level: 120  ?Frequency: 16 ?Impulse/cycles: 2500 ?Head size: large ?  ?Patient tolerated procedure well without immediate complications ? ? ? ?Luis Abed, D.O.  ?PGY-4 [facility]  ?10/11/2021 8:24 AM ? ?Addendum:  I was the preceptor for this visit and available for immediate consultation.  Norton Blizzard MD CAQSM ? ?

## 2021-10-19 ENCOUNTER — Ambulatory Visit (INDEPENDENT_AMBULATORY_CARE_PROVIDER_SITE_OTHER): Payer: Self-pay | Admitting: Family Medicine

## 2021-10-19 DIAGNOSIS — M722 Plantar fascial fibromatosis: Secondary | ICD-10-CM

## 2021-10-19 NOTE — Progress Notes (Signed)
? ?  Duane Shaw is a 41 y.o. male who presents to [facility] today for the following: ? ?Left Planter fasciitis ?Patient presents today for his fourth shockwave therapy ?Still having slight improvement, but more so medial ankle pain ? ?Procedure: ECSWT ?Indications: Left plantar fasciitis ?  ?Procedure Details ?Consent: Risks of procedure as well as the alternatives and risks of each were explained to the patient.  Written consent for procedure obtained. ?Time Out: Verified patient identification, verified procedure, site was marked, verified correct patient position, medications/allergies/relevent history reviewed.  The area was cleaned with alcohol swab.   ?  ?The left plantar fascia was targeted for Extracorporeal shockwave therapy.  ?  ?Preset: Chronic Planter fasciitis ?Power Level: 120 ?Frequency: 16 ?Impulse/cycles: 2500 ?Head size: Large ?  ?Patient tolerated procedure well without immediate complications. ?He plans to come back next week for another session. ?Recommended follow-up with Dr. Denyse Amass to discuss other possible etiologies for pain if not making improvement with this, especially now that he is having more medial ankle pain.  He does report some burning sensation.  Could consider tarsal tunnel syndrome versus posterior tibialis tendinopathy.  Would defer this work-up and treatment to Dr. Denyse Amass. ?  ?Luis Abed, D.O.  ?PGY-4 [facility]  ?10/19/2021 3:29 PM ? ?I was the preceptor for this visit and available for immediate consultation ?Marsa Aris, DO ?

## 2021-10-25 ENCOUNTER — Ambulatory Visit (INDEPENDENT_AMBULATORY_CARE_PROVIDER_SITE_OTHER): Payer: Self-pay | Admitting: Family Medicine

## 2021-10-25 VITALS — Ht 74.0 in

## 2021-10-25 DIAGNOSIS — M722 Plantar fascial fibromatosis: Secondary | ICD-10-CM

## 2021-10-25 NOTE — Progress Notes (Signed)
? ?  Duane Shaw is a 41 y.o. male who presents to [facility] today for the following: ? ?Left Planter fasciitis ?Patient presents today for his fifth shockwave therapy ?Was getting marginally better with improvements until last, has been having soreness since then ?No change in activity level ?Has been sitting at a desk all day today and states it is the most sore today that it has been in quite some time ? ? ?Procedure: ECSWT ?Indications: Planter fasciitis ?  ?Procedure Details ?Consent: Risks of procedure as well as the alternatives and risks of each were explained to the patient.  Written consent for procedure obtained. ?Time Out: Verified patient identification, verified procedure, site was marked, verified correct patient position, medications/allergies/relevent history reviewed.  The area was cleaned with alcohol swab.   ?  ?The left plantar fascia was targeted for Extracorporeal shockwave therapy.  ?  ?Preset: Chronic Planter fasciitis ?Power Level: 120 ?Frequency: 16 ?Impulse/cycles: 2500 ?Head size: large ?  ?Patient tolerated procedure well without immediate complications ?Discussed with patient that we would hold off on any further treatments at this time until he is reevaluated by Dr. Denyse Amass for consideration of other causes of pain ? ?Luis Abed, D.O.  ?PGY-4 [facility]  ?10/25/2021 3:34 PM ? ?Addendum:  I was the preceptor for this visit and available for immediate consultation.  Norton Blizzard MD CAQSM ? ?

## 2021-11-07 ENCOUNTER — Ambulatory Visit (INDEPENDENT_AMBULATORY_CARE_PROVIDER_SITE_OTHER): Payer: 59 | Admitting: Family Medicine

## 2021-11-07 ENCOUNTER — Encounter: Payer: Self-pay | Admitting: Family Medicine

## 2021-11-07 ENCOUNTER — Ambulatory Visit: Payer: Self-pay

## 2021-11-07 VITALS — BP 128/86 | HR 93 | Ht 74.0 in | Wt 294.6 lb

## 2021-11-07 DIAGNOSIS — M722 Plantar fascial fibromatosis: Secondary | ICD-10-CM

## 2021-11-07 NOTE — Progress Notes (Unsigned)
   I, Duane Shaw, LAT, ATC acting as a scribe for Duane Leader, MD.  Duane Shaw is a 41 y.o. male who presents to [place] at [facility] today for f/u L foot pain thought to be due to plantar fascitis. Pt completed a course of HEP and use of nitro patches without much benefit. Pt was last seen by Dr. [name] on 09/26/21 and was referred for shockwave therapy, completing 5 visits. Today, pt reports that his L heel is somewhat better but not much.  He notes an aching pain along the borders of his calcaneus.  He will also experience a burning pain along his medial midfoot that radiates up along his medial ankle.  He is still using the nitroglycerin patches but only every other day.  Dx imaging: 08/24/21 L Os calcis XR  Pertinent review of systems: ***  Relevant historical information: ***   Exam:  There were no vitals taken for this visit. General: Well Developed, well nourished, and in no acute distress.   MSK: ***    Lab and Radiology Results No results found for this or any previous visit (from the past 72 hour(s)). No results found.     Assessment and Plan: 41 y.o. male with ***   PDMP not reviewed this encounter. No orders of the defined types were placed in this encounter.  No orders of the defined types were placed in this encounter.    Discussed warning signs or symptoms. Please see discharge instructions. Patient expresses understanding.   ***

## 2021-11-07 NOTE — Patient Instructions (Signed)
Good to see you today.  You had a L heel injection.  Call or go to the ER if you develop a large red swollen joint with extreme pain or oozing puss.   Follow-up: one month  Let me know before if you're not better and I'll order an MRI.

## 2021-12-04 ENCOUNTER — Ambulatory Visit: Payer: 59 | Admitting: Family Medicine

## 2021-12-05 NOTE — Progress Notes (Unsigned)
   I, Christoper Fabian, LAT, ATC, am serving as scribe for Dr. Clementeen Graham.  Duane Shaw is a 41 y.o. male who presents to [facility] today for f/u L plantar heel pain thought to be due to plantar fascitis. Pt completed a course of HEP and use of nitro patches without much benefit. He was last seen by Dr. Denyse Amass on 11/07/21 after having completed 5 shockwave sessions and noted only minimal improvement.  He had a L plantar fascia steroid injection.  Today, pt reports   Dx imaging: 08/24/21 L Os calcis XR Pertinent review of systems: ***  Relevant historical information: ***   Exam:  There were no vitals taken for this visit. General: Well Developed, well nourished, and in no acute distress.   MSK: ***    Lab and Radiology Results No results found for this or any previous visit (from the past 72 hour(s)). No results found.     Assessment and Plan: 41 y.o. male with ***   PDMP not reviewed this encounter. No orders of the defined types were placed in this encounter.  No orders of the defined types were placed in this encounter.    Discussed warning signs or symptoms. Please see discharge instructions. Patient expresses understanding.   ***

## 2021-12-11 ENCOUNTER — Ambulatory Visit (INDEPENDENT_AMBULATORY_CARE_PROVIDER_SITE_OTHER): Payer: 59 | Admitting: Family Medicine

## 2021-12-11 ENCOUNTER — Encounter: Payer: Self-pay | Admitting: Family Medicine

## 2021-12-11 VITALS — BP 116/80 | HR 66 | Ht 74.0 in | Wt 294.8 lb

## 2021-12-11 DIAGNOSIS — M722 Plantar fascial fibromatosis: Secondary | ICD-10-CM

## 2022-11-20 IMAGING — DX DG OS CALCIS 2+V*L*
2 series · 2 of 2 positions shown · non-contrast
Comparison: CT left ankle 10/20/2013

CLINICAL DATA: Left heel pain for 5 months.

EXAM:
LEFT OS CALCIS - 2+ VIEW

[calcaneus axial]
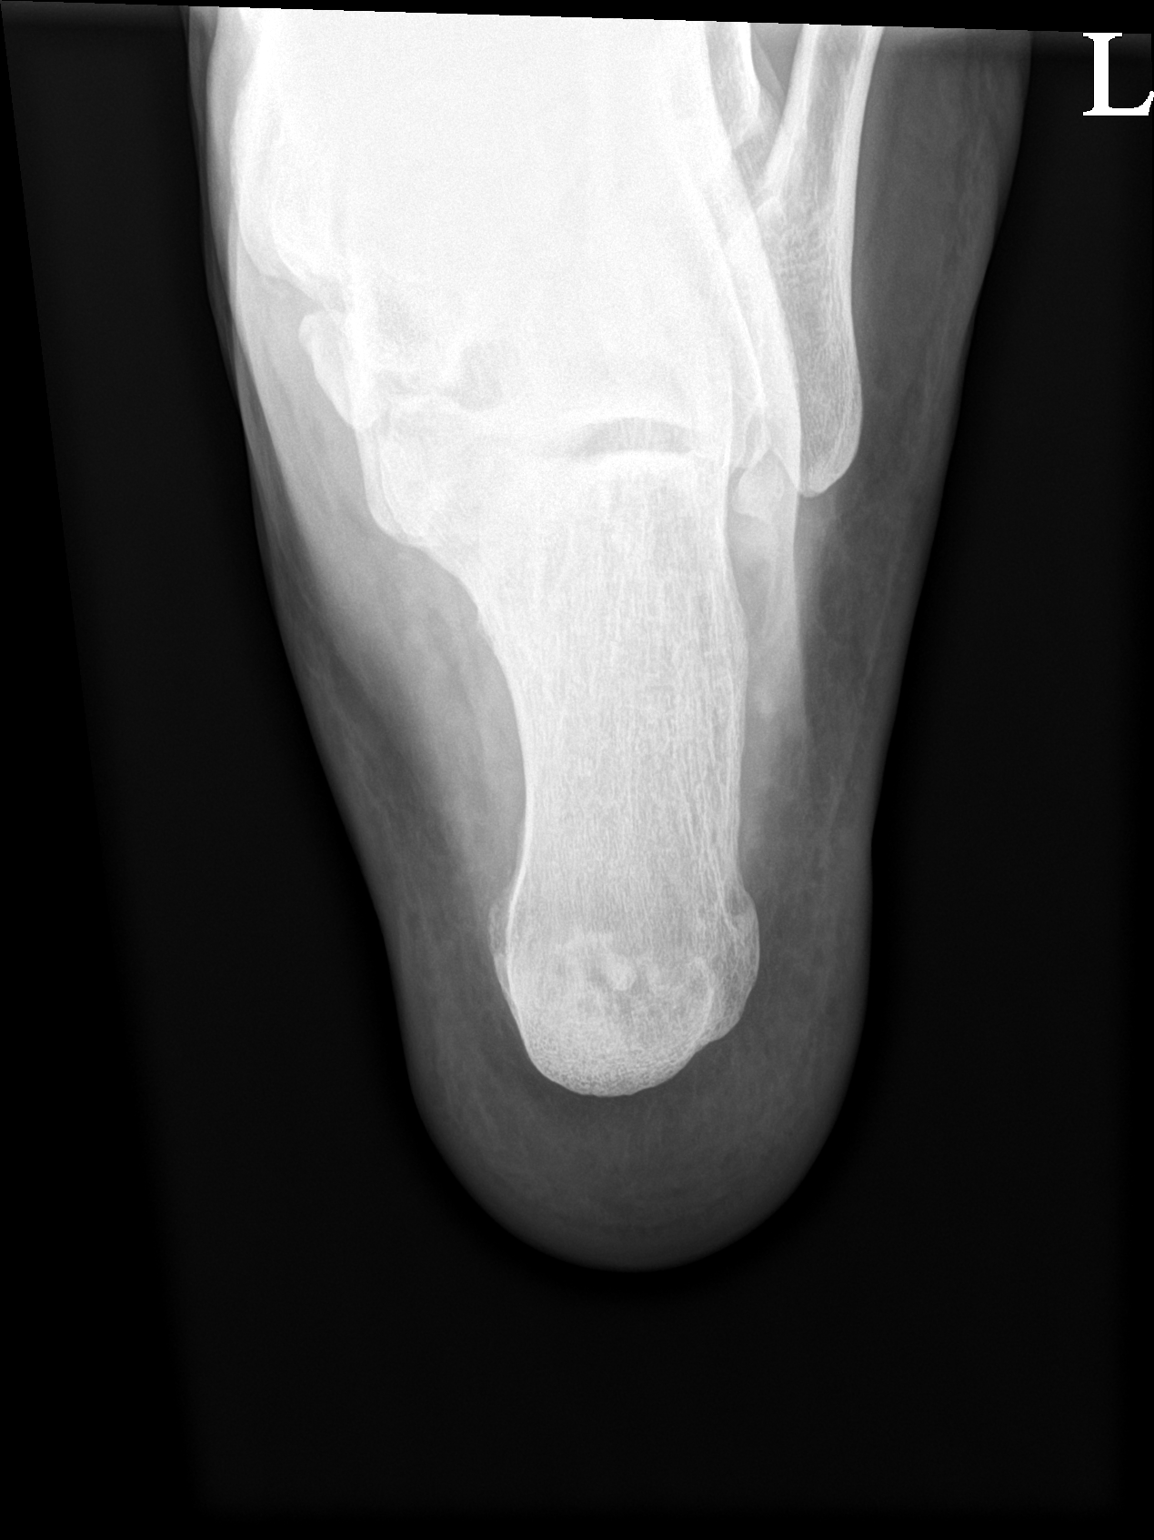

[calcaneus lat]
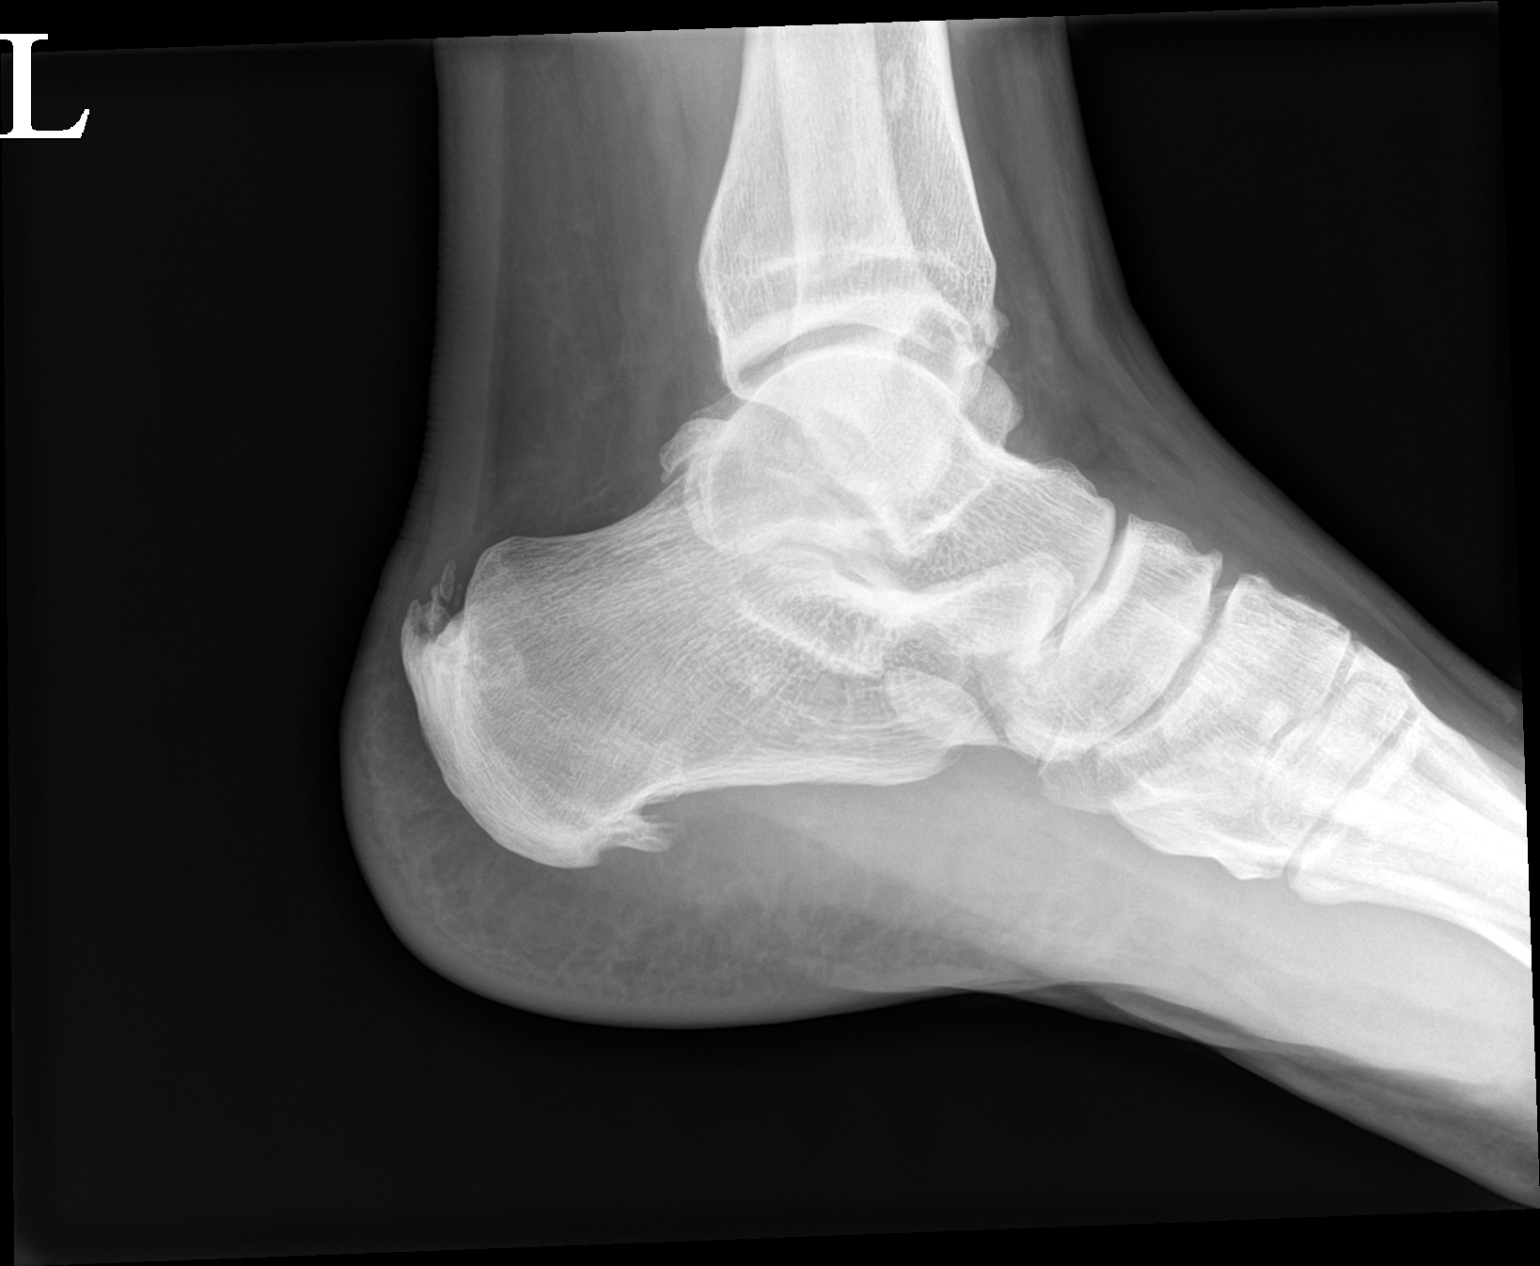

[2 of 2 positions shown; findings below may reference images not displayed]

FINDINGS: Moderate chronic spurring at the Achilles insertion on the
calcaneus. Moderate plantar calcaneal heel spur appears mildly
increased from 10/20/2013 CT. Mild dorsal navicular-cuneiform
degenerative osteophytosis. Mild distal anterior tibial plafond
degenerative osteophytosis.
IMPRESSION: Moderate chronic spurring at the Achilles and plantar fascia
insertions on the calcaneus.
